# Patient Record
Sex: Female | Born: 1980 | Hispanic: Yes | Marital: Single | Smoking: Never smoker
Health system: Southern US, Community
[De-identification: ages and names within clinical notes are randomized; demographics above are authoritative.]

## PROBLEM LIST (undated history)

## (undated) DIAGNOSIS — E079 Disorder of thyroid, unspecified: Secondary | ICD-10-CM

## (undated) DIAGNOSIS — E7800 Pure hypercholesterolemia, unspecified: Secondary | ICD-10-CM

---

## 2007-11-10 ENCOUNTER — Inpatient Hospital Stay: Payer: Self-pay | Admitting: Obstetrics and Gynecology

## 2008-06-13 ENCOUNTER — Emergency Department: Payer: Self-pay | Admitting: Emergency Medicine

## 2013-06-01 ENCOUNTER — Emergency Department: Payer: Self-pay | Admitting: Emergency Medicine

## 2016-02-22 ENCOUNTER — Other Ambulatory Visit: Payer: Self-pay | Admitting: Family Medicine

## 2016-02-22 DIAGNOSIS — Z3481 Encounter for supervision of other normal pregnancy, first trimester: Secondary | ICD-10-CM

## 2016-02-26 ENCOUNTER — Ambulatory Visit
Admission: RE | Admit: 2016-02-26 | Discharge: 2016-02-26 | Disposition: A | Discharge status: Home / Self Care | Payer: Medicaid Other | Source: Ambulatory Visit | Attending: Family Medicine | Admitting: Family Medicine

## 2016-02-26 DIAGNOSIS — Z3A15 15 weeks gestation of pregnancy: Secondary | ICD-10-CM | POA: Insufficient documentation

## 2016-02-26 DIAGNOSIS — N83202 Unspecified ovarian cyst, left side: Secondary | ICD-10-CM | POA: Insufficient documentation

## 2016-02-26 DIAGNOSIS — N83201 Unspecified ovarian cyst, right side: Secondary | ICD-10-CM | POA: Diagnosis not present

## 2016-02-26 DIAGNOSIS — Z3481 Encounter for supervision of other normal pregnancy, first trimester: Secondary | ICD-10-CM

## 2016-02-26 DIAGNOSIS — Z3482 Encounter for supervision of other normal pregnancy, second trimester: Secondary | ICD-10-CM | POA: Diagnosis not present

## 2019-01-01 ENCOUNTER — Ambulatory Visit
Admission: EM | Admit: 2019-01-01 | Discharge: 2019-01-01 | Disposition: A | Discharge status: Home / Self Care | Payer: Self-pay | Attending: Family Medicine | Admitting: Family Medicine

## 2019-01-01 ENCOUNTER — Other Ambulatory Visit: Payer: Self-pay

## 2019-01-01 ENCOUNTER — Encounter: Payer: Self-pay | Admitting: Emergency Medicine

## 2019-01-01 DIAGNOSIS — B9689 Other specified bacterial agents as the cause of diseases classified elsewhere: Secondary | ICD-10-CM

## 2019-01-01 DIAGNOSIS — N760 Acute vaginitis: Secondary | ICD-10-CM

## 2019-01-01 HISTORY — DX: Disorder of thyroid, unspecified: E07.9

## 2019-01-01 HISTORY — DX: Pure hypercholesterolemia, unspecified: E78.00

## 2019-01-01 LAB — URINALYSIS, COMPLETE (UACMP) WITH MICROSCOPIC
Bilirubin Urine: NEGATIVE
Glucose, UA: NEGATIVE mg/dL
Hgb urine dipstick: NEGATIVE
Ketones, ur: NEGATIVE mg/dL
Leukocytes,Ua: NEGATIVE
Nitrite: NEGATIVE
Protein, ur: NEGATIVE mg/dL
RBC / HPF: NONE SEEN RBC/hpf (ref 0–5)
Specific Gravity, Urine: 1.02 (ref 1.005–1.030)
pH: 5 (ref 5.0–8.0)

## 2019-01-01 LAB — WET PREP, GENITAL
Sperm: NONE SEEN
Trich, Wet Prep: NONE SEEN
Yeast Wet Prep HPF POC: NONE SEEN

## 2019-01-01 MED ORDER — METRONIDAZOLE 500 MG PO TABS: 500.0000 mg | ORAL_TABLET | Freq: Two times a day (BID) | ORAL | 0 refills | Status: AC

## 2019-01-01 NOTE — ED Triage Notes (Signed)
Patient c/o vaginal itching that started on Monday after she finished her menstrual cycle.  Patient denies vaginal discharge.  Patient denies any pain.

## 2019-01-01 NOTE — Discharge Instructions (Signed)
Tienes vaginosis bacteriana.  Esto es causado por bacterias en la vagina.  Tome el antibitico segn lo prescrito.  Dr. Adriana Simas

## 2019-01-01 NOTE — ED Notes (Signed)
Interpretor [id number] used during triage.

## 2019-01-01 NOTE — ED Provider Notes (Signed)
[facility]    CSN: [id number] Arrival date & time: 01/01/19  1008  History   Chief Complaint Chief Complaint  Patient presents with  . Vaginal Itching   HPI  38 year old female presents with the above complaint.  Spanish Interpreter used: [phone].  Patient reports a one-week history of vaginal itching.  She states that it started after she began her menstrual cycle.  Has continued to persist despite cessation of her menstrual cycle.  She reports that she has had dysuria as well.  Denies urinary frequency.  No abdominal pain.  No fever.  No medications or interventions tried.  No known exacerbating or relieving factors.  No other complaints.  PMH, Surgical Hx, Family Hx, Social History reviewed and updated as below.  Past Medical History:  Diagnosis Date  . Hypercholesterolemia   . Thyroid disease    Past Surgical History:  Procedure Laterality Date  . CESAREAN SECTION      OB History   No obstetric history on file.      Home Medications    Prior to Admission medications   Medication Sig Start Date End Date Taking? Authorizing Provider  levothyroxine (SYNTHROID) 50 MCG tablet Take 50 mcg by mouth every morning. 10/18/18  Yes [provider]  simvastatin (ZOCOR) 20 MG tablet Take 20 mg by mouth at bedtime. 10/18/18  Yes [provider]  metroNIDAZOLE (FLAGYL) 500 MG tablet Take 1 tablet (500 mg total) by mouth 2 (two) times daily. 01/01/19   Tommie Sams, DO    Family History Family History  Problem Relation Age of Onset  . Healthy Mother   . Hypertension Father   . Hyperlipidemia Father     Social History Social History   Tobacco Use  . Smoking status: Never Smoker  . Smokeless tobacco: Never Used  Substance Use Topics  . Alcohol use: Never    Frequency: Never  . Drug use: Never     Allergies   Patient has no known allergies.   Review of Systems Review of Systems  Constitutional: Negative.   Gastrointestinal:  Negative for abdominal pain.  Genitourinary: Positive for dysuria.       Vaginal itching.   Physical Exam Triage Vital Signs ED Triage Vitals  Enc Vitals Group     BP 01/01/19 1023 124/72     Pulse Rate 01/01/19 1023 69     Resp 01/01/19 1023 14     Temp 01/01/19 1023 98 F (36.7 C)     Temp Source 01/01/19 1023 Oral     SpO2 01/01/19 1023 100 %     Weight 01/01/19 1019 194 lb (88 kg)     Height 01/01/19 1019 5' (1.524 m)     Head Circumference --      Peak Flow --      Pain Score 01/01/19 1019 0     Pain Loc --      Pain Edu? --      Excl. in GC? --    Updated Vital Signs BP 124/72 (BP Location: Left Arm)   Pulse 69   Temp 98 F (36.7 C) (Oral)   Resp 14   Ht 5' (1.524 m)   Wt 88 kg   LMP 12/23/2018 (Exact Date)   SpO2 100%   BMI 37.89 kg/m   Visual Acuity Right Eye Distance:   Left Eye Distance:   Bilateral Distance:    Right Eye Near:   Left Eye Near:    Bilateral Near:  Physical Exam Constitutional:      General: She is not in acute distress.    Appearance: Normal appearance. She is obese.  HENT:     Head: Normocephalic and atraumatic.  Eyes:     General:        Right eye: No discharge.        Left eye: No discharge.     Conjunctiva/sclera: Conjunctivae normal.  Cardiovascular:     Rate and Rhythm: Normal rate and regular rhythm.  Pulmonary:     Effort: Pulmonary effort is normal.     Breath sounds: Normal breath sounds.  Abdominal:     General: There is no distension.     Palpations: Abdomen is soft.     Tenderness: There is no abdominal tenderness.  Neurological:     Mental Status: She is alert.  Psychiatric:        Mood and Affect: Mood normal.        Behavior: Behavior normal.    UC Treatments / Results  Labs (all labs ordered are listed, but only abnormal results are displayed) Labs Reviewed  WET PREP, GENITAL - Abnormal; Notable for the following components:      Result Value   Clue Cells Wet Prep HPF POC PRESENT (*)    WBC,  Wet Prep HPF POC MODERATE (*)    All other components within normal limits  URINALYSIS, COMPLETE (UACMP) WITH MICROSCOPIC - Abnormal; Notable for the following components:   Bacteria, UA RARE (*)    All other components within normal limits    EKG None  Radiology No results found.  Procedures Procedures (including critical care time)  Medications Ordered in UC Medications - No data to display  Initial Impression / Assessment and Plan / UC Course  I have reviewed the triage vital signs and the nursing notes.  Pertinent labs & imaging results that were available during my care of the patient were reviewed by me and considered in my medical decision making (see chart for details).    38 year old female presents with vaginal itching and dysuria.  Analysis negative.  Wet prep with clue cells consistent with bacterial vaginosis.  Treating with Flagyl.  Final Clinical Impressions(s) / UC Diagnoses   Final diagnoses:  BV (bacterial vaginosis)     Discharge Instructions     Tienes vaginosis bacteriana.  Esto es causado por bacterias en la vagina.  Tome el antibitico segn lo prescrito.  Dr. Adriana Simasook    ED Prescriptions    Medication Sig Dispense Auth. Provider   metroNIDAZOLE (FLAGYL) 500 MG tablet Take 1 tablet (500 mg total) by mouth 2 (two) times daily. 14 tablet Tommie Samsook, Rogelio Winbush G, DO     Controlled Substance Prescriptions [place] Controlled Substance Registry consulted? Not Applicable   Tommie SamsCook, Kathyjo Briere G, DO 01/01/19 1057

## 2019-04-29 ENCOUNTER — Other Ambulatory Visit: Payer: Self-pay

## 2019-04-29 DIAGNOSIS — Z20822 Contact with and (suspected) exposure to covid-19: Secondary | ICD-10-CM

## 2019-04-30 LAB — NOVEL CORONAVIRUS, NAA: SARS-CoV-2, NAA: DETECTED — AB

## 2019-05-19 ENCOUNTER — Other Ambulatory Visit: Payer: Self-pay

## 2019-05-19 DIAGNOSIS — Z20822 Contact with and (suspected) exposure to covid-19: Secondary | ICD-10-CM

## 2019-05-20 LAB — NOVEL CORONAVIRUS, NAA: SARS-CoV-2, NAA: NOT DETECTED

## 2020-01-04 ENCOUNTER — Other Ambulatory Visit: Payer: Self-pay | Admitting: Obstetrics and Gynecology

## 2020-01-04 DIAGNOSIS — N83201 Unspecified ovarian cyst, right side: Secondary | ICD-10-CM

## 2020-01-04 DIAGNOSIS — N83202 Unspecified ovarian cyst, left side: Secondary | ICD-10-CM

## 2020-01-11 ENCOUNTER — Ambulatory Visit
Admission: RE | Admit: 2020-01-11 | Discharge: 2020-01-11 | Disposition: A | Discharge status: Home / Self Care | Payer: Self-pay | Source: Ambulatory Visit | Attending: Obstetrics and Gynecology | Admitting: Obstetrics and Gynecology

## 2020-01-11 ENCOUNTER — Other Ambulatory Visit: Payer: Self-pay

## 2020-01-11 DIAGNOSIS — N83202 Unspecified ovarian cyst, left side: Secondary | ICD-10-CM | POA: Insufficient documentation

## 2020-01-11 DIAGNOSIS — N83201 Unspecified ovarian cyst, right side: Secondary | ICD-10-CM | POA: Insufficient documentation

## 2021-10-04 IMAGING — US US PELVIS COMPLETE WITH TRANSVAGINAL
2 series · 13 of 25 positions shown · non-contrast
Comparison: None

CLINICAL DATA: Ovarian cysts bilaterally, history of Caesarean
section

EXAM:
TRANSABDOMINAL AND TRANSVAGINAL ULTRASOUND OF PELVIS
TECHNIQUE: Both transabdominal and transvaginal ultrasound examinations of the
pelvis were performed. Transabdominal technique was performed for
global imaging of the pelvis including uterus, ovaries, adnexal
regions, and pelvic cul-de-sac. It was necessary to proceed with
endovaginal exam following the transabdominal exam to visualize the
endometrium and ovaries.

[Series 1: us pelvic complete with transvaginal · 12 of 137 slices shown]
[im 1/137]
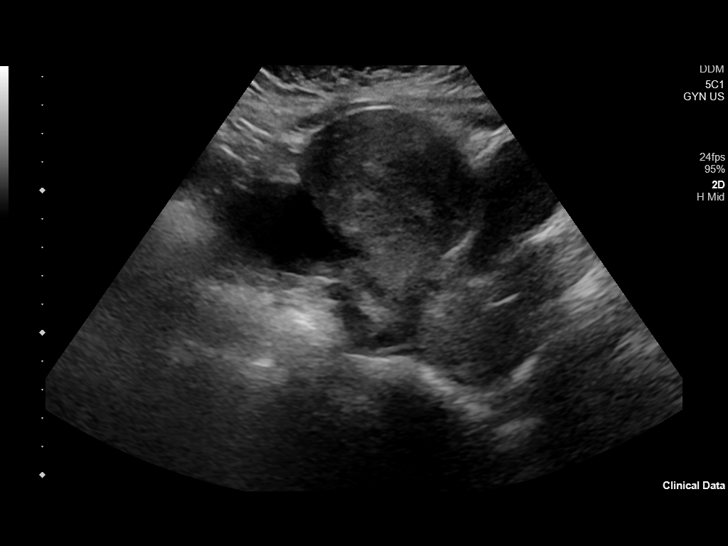
[im 12/137]
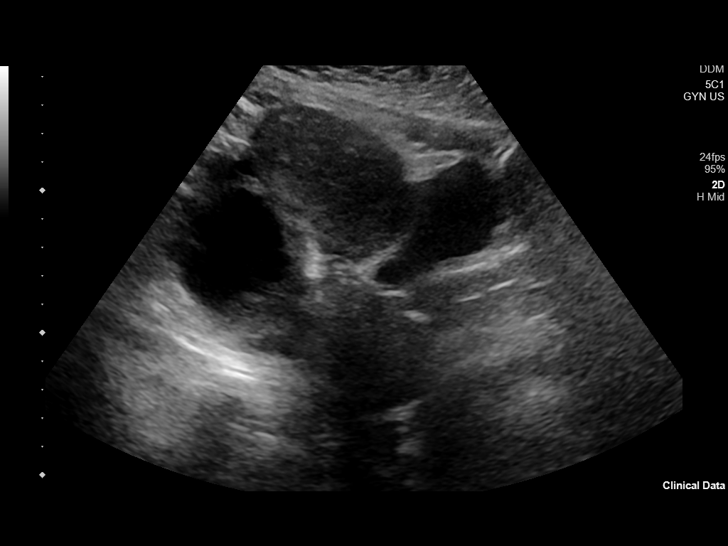
[im 24/137]
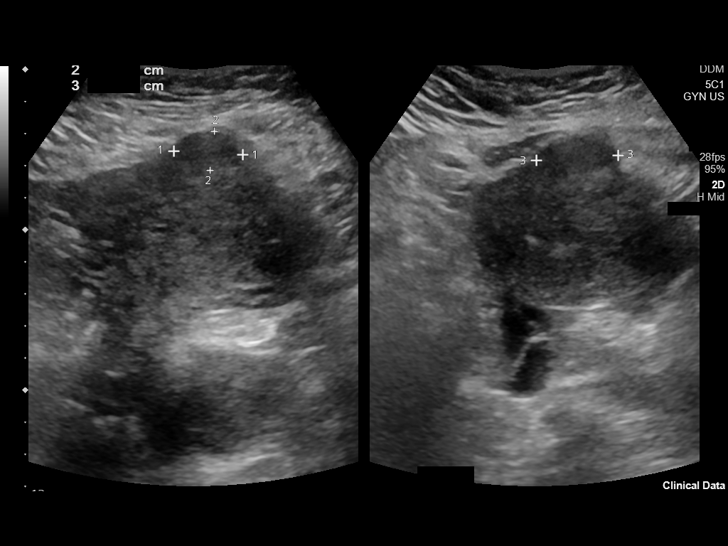
[im 36/137]
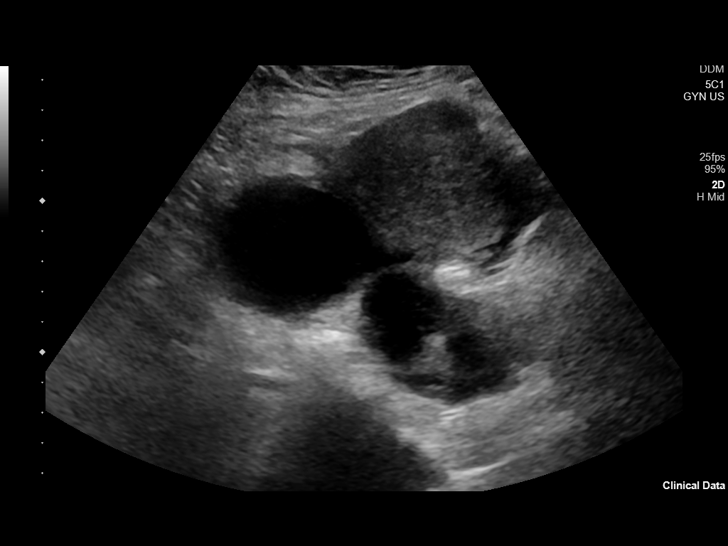
[im 48/137]
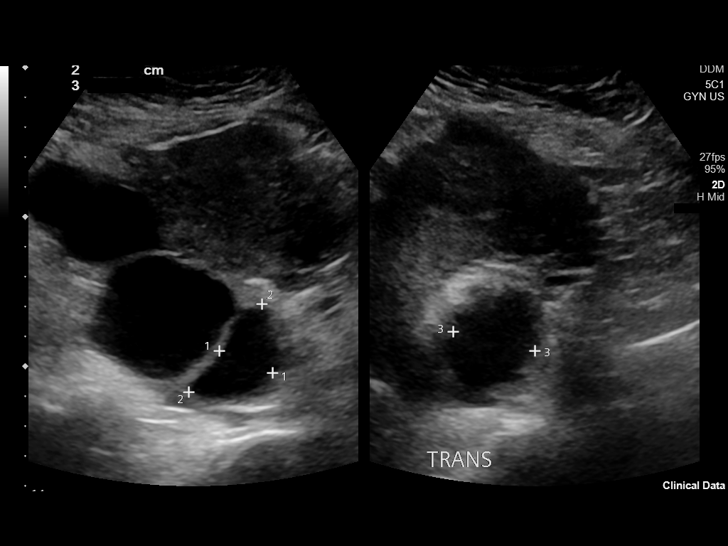
[im 60/137]
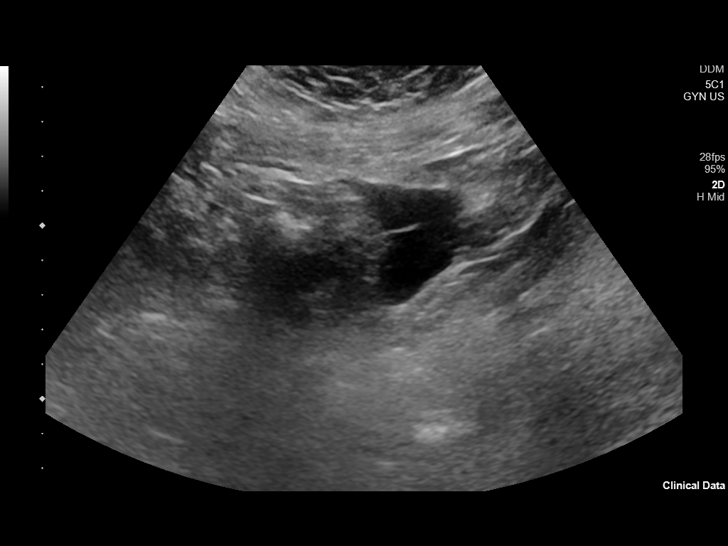
[im 71/137]
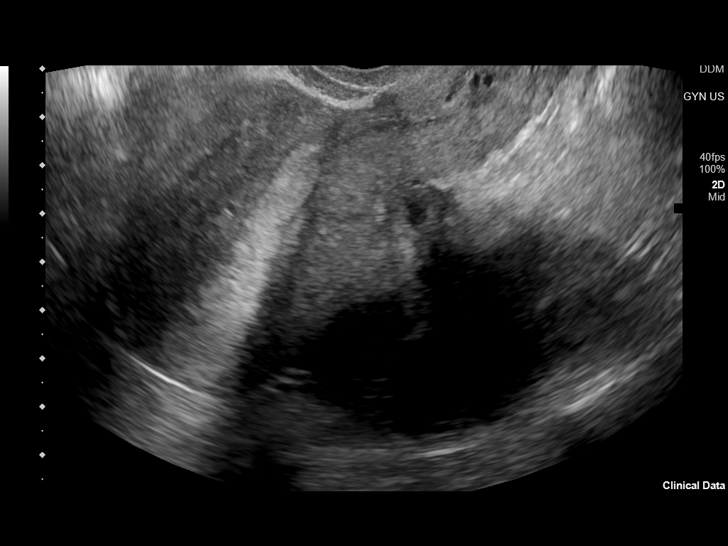
[im 83/137]
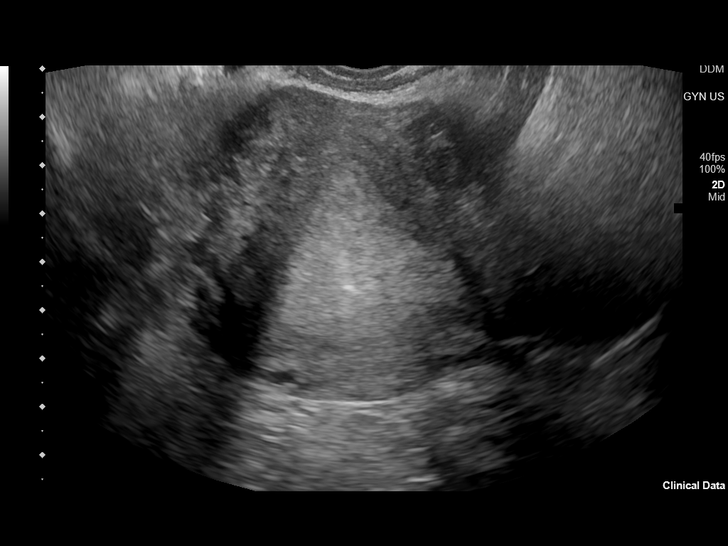
[im 95/137]
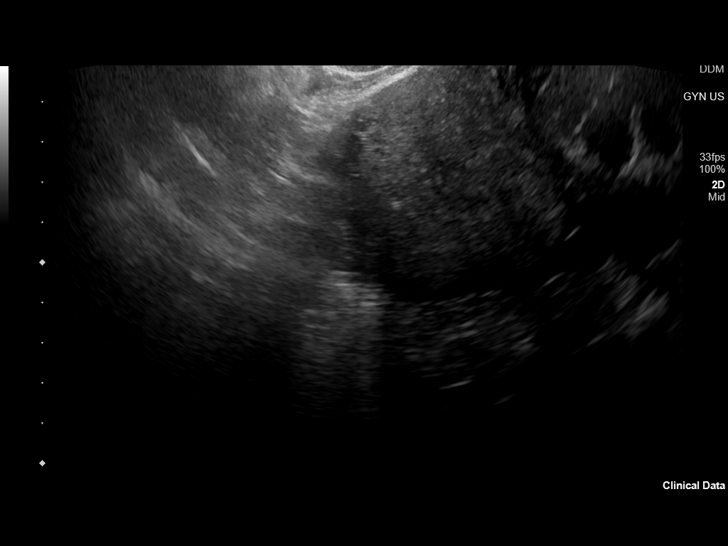
[im 107/137]
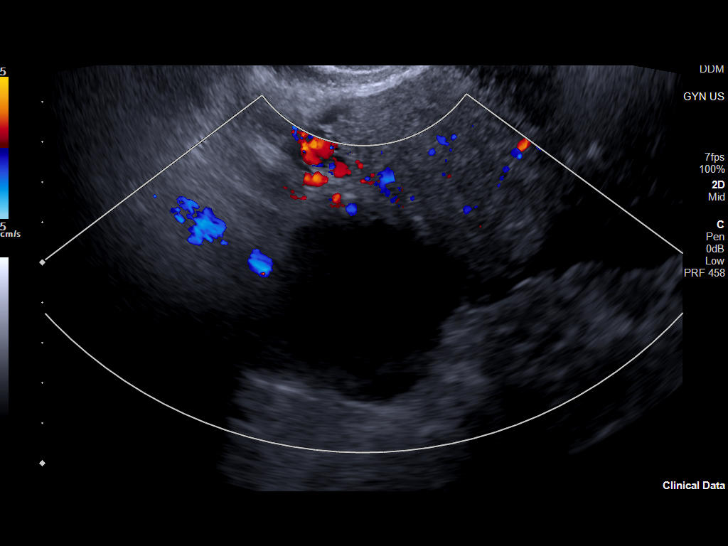
[im 119/137]
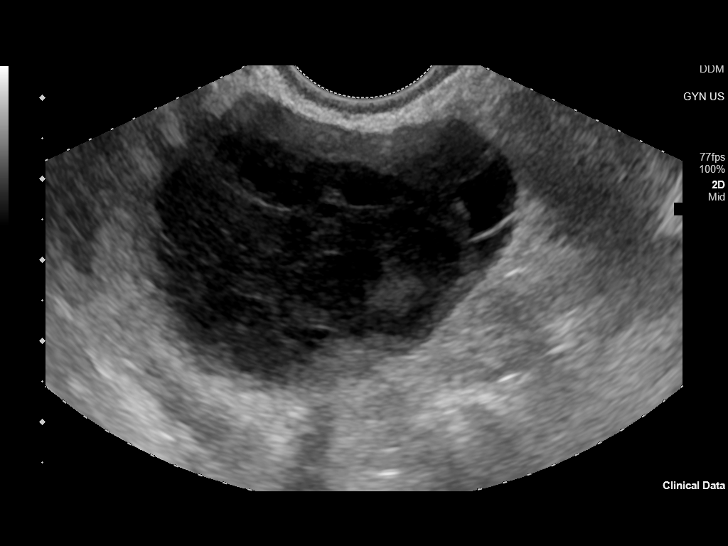
[im 131/137]
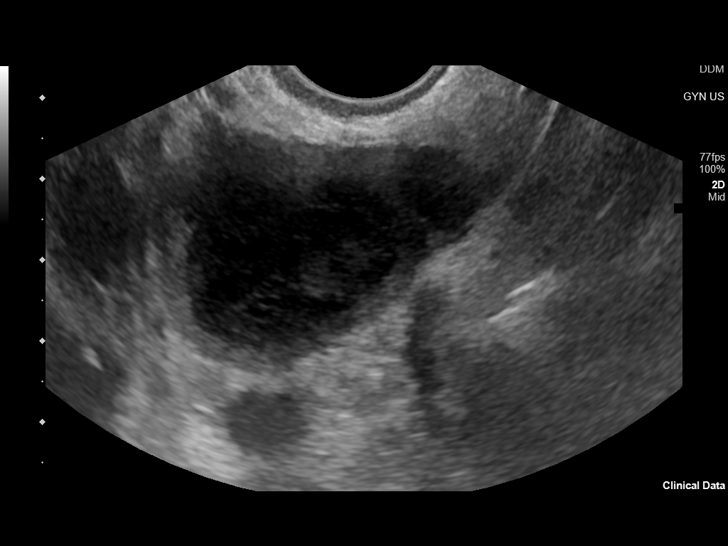

[Series 1002: gyn us · 1 of 2 slices shown]
[im 1/2]
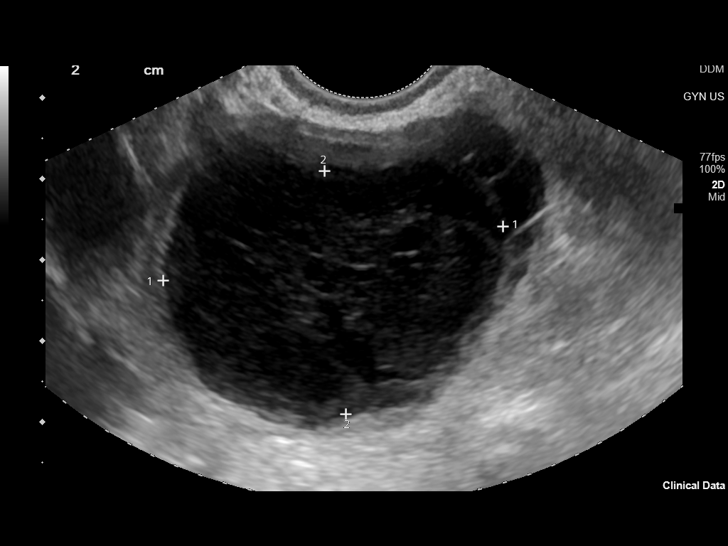

[13 of 25 positions shown; findings below may reference images not displayed]

FINDINGS: Uterus

Measurements: 11.1 x 5.4 x 7.6 cm = volume: 237 mL. Anteverted and
mildly retroflexed. Nabothian cysts at cervix. Several uterine
leiomyomata are identified including a 2.2 x 1.2 x 2.6 cm leiomyoma
at anterior upper uterine segment, subserosal leiomyoma posterior
mid uterus 2.0 x 1.3 x 1.3 cm, and an anterior LEFT subserosal
leiomyoma 2.7 x 2.7 x 2.5 cm.

Endometrium

Thickness: 8 mm.  No endometrial fluid or focal abnormality

Right ovary

No normal appearing RIGHT ovary visualized. In the RIGHT adnexa at
least 3 large cysts are identified, potentially of RIGHT ovarian
origin, largest 5.7 x 5.3 x 4.4 cm. In aggregate these 3 lesions
measure 11.7 x 5.6 x 8.3 cm nurse operated by thin septations. The
smallest of the 3 lesions contain scattered internal echogenicity in
appears more complex.

Left ovary

Measurements: 5.1 x 2.9 x 2.9 cm = volume: 21.9 mL. Large
hemorrhagic cyst of LEFT ovary 4.3 x 3.0 x 3.1 cm.

Other findings

No free pelvic fluid or additional adnexal masses.
IMPRESSION: Multiple uterine leiomyomata.

Hemorrhagic cyst LEFT ovary 4.3 cm greatest size.

3 large adnexal cysts in aggregate 11.7 x 5.6 x 8.3 cm, with largest
locule measuring 5.7 cm in greatest size and the smallest lesion
containing internal echogenicity/complexity; either characterization
by MR or follow-up ultrasound in 6-12 weeks recommended to reassess.

## 2022-11-25 ENCOUNTER — Encounter: Payer: Self-pay | Admitting: Family Medicine

## 2022-12-16 ENCOUNTER — Other Ambulatory Visit: Payer: Self-pay

## 2022-12-16 DIAGNOSIS — Z1231 Encounter for screening mammogram for malignant neoplasm of breast: Secondary | ICD-10-CM

## 2022-12-23 ENCOUNTER — Ambulatory Visit
Admission: RE | Admit: 2022-12-23 | Discharge: 2022-12-23 | Disposition: A | Discharge status: Home / Self Care | Payer: Self-pay | Source: Ambulatory Visit | Attending: Obstetrics and Gynecology | Admitting: Obstetrics and Gynecology

## 2022-12-23 ENCOUNTER — Ambulatory Visit: Payer: Self-pay | Attending: Hematology and Oncology | Admitting: Hematology and Oncology

## 2022-12-23 VITALS — BP 116/79 | Wt 208.6 lb

## 2022-12-23 DIAGNOSIS — Z1231 Encounter for screening mammogram for malignant neoplasm of breast: Secondary | ICD-10-CM

## 2022-12-23 NOTE — Patient Instructions (Signed)
[name] about self breast awareness and gave educational materials to take home. Patient did not need a Pap smear today due to last Pap smear was in 2022 per patient. Let her know [facility] will cover Pap smears every 5 years unless has a history of abnormal Pap smears. [facility] Appointment scheduled for 12/23/22. Patient aware of appointment and will be there. Let patient know will follow up with her within the next couple weeks with results. [name] verbalized understanding.  Pascal Lux, NP 10:04 AM

## 2022-12-23 NOTE — Progress Notes (Signed)
Ms. Tara Beck is a 42 y.o. female who presents to [facility] clinic today with no complaints.    Pap Smear: Pap not smear completed today. Last Pap smear was 2022 at [facility] clinic and was normal. Per patient has no history of an abnormal Pap smear. Last Pap smear result is available in Epic.   Physical exam: Breasts Breasts symmetrical. No skin abnormalities bilateral breasts. No nipple retraction bilateral breasts. No nipple discharge bilateral breasts. No lymphadenopathy. No lumps palpated bilateral breasts.        Pelvic/Bimanual Pap is not indicated today    Smoking History: Patient has never smoked and was not referred to quit line.    Patient Navigation: Patient education provided. Access to services provided for patient through [facility] program. Tara Beck interpreter provided. No transportation provided   Colorectal Cancer Screening: Per patient has never had colonoscopy completed No complaints today.    Breast and Cervical Cancer Risk Assessment: Patient does not have family history of breast cancer, known genetic mutations, or radiation treatment to the chest before age 75. Patient does not have history of cervical dysplasia, immunocompromised, or DES exposure in-utero.  Risk Assessment   No risk assessment data     A: [facility] exam without pap smear No complaints with benign exam.   P: [facility]. Appointment scheduled 12/23/22.  Tara Beck A, NP 12/23/2022 10:03 AM

## 2024-01-05 ENCOUNTER — Other Ambulatory Visit: Payer: Self-pay

## 2024-01-05 DIAGNOSIS — Z1231 Encounter for screening mammogram for malignant neoplasm of breast: Secondary | ICD-10-CM

## 2024-02-15 ENCOUNTER — Ambulatory Visit: Payer: Self-pay | Attending: Obstetrics and Gynecology | Admitting: *Deleted

## 2024-02-15 ENCOUNTER — Ambulatory Visit
Admission: RE | Admit: 2024-02-15 | Discharge: 2024-02-15 | Disposition: A | Discharge status: Home / Self Care | Payer: Self-pay | Source: Ambulatory Visit | Attending: Obstetrics and Gynecology | Admitting: Obstetrics and Gynecology

## 2024-02-15 VITALS — BP 102/62 | Wt 196.1 lb

## 2024-02-15 DIAGNOSIS — Z1231 Encounter for screening mammogram for malignant neoplasm of breast: Secondary | ICD-10-CM | POA: Insufficient documentation

## 2024-02-15 DIAGNOSIS — Z1239 Encounter for other screening for malignant neoplasm of breast: Secondary | ICD-10-CM

## 2024-02-15 NOTE — Progress Notes (Signed)
 Ms. Tara Beck is a 43 y.o. female who presents to [facility] clinic today with no complaints.    Pap Smear: Pap smear not completed today. Last Pap smear was 10/15/2023 at [facility] clinic and was normal with negative HPV. Per patient has no history of an abnormal Pap smear. Last Pap smear result is available in Epic.   Physical exam: Breasts Breasts symmetrical. No skin abnormalities bilateral breasts. No nipple retraction bilateral breasts. No nipple discharge bilateral breasts. No lymphadenopathy. No lumps palpated bilateral breasts. No complaints of pain or tenderness on exam.  MS 3D SCR MAMMO BILAT BR (aka MM) Result Date: 12/24/2022 CLINICAL DATA:  Screening. EXAM: DIGITAL SCREENING BILATERAL MAMMOGRAM WITH TOMOSYNTHESIS AND CAD TECHNIQUE: Bilateral screening digital craniocaudal and mediolateral oblique mammograms were obtained. Bilateral screening digital breast tomosynthesis was performed. The images were evaluated with computer-aided detection. COMPARISON:  None available. ACR Breast Density Category c: The breasts are heterogeneously dense, which may obscure small masses. FINDINGS: There are no findings suspicious for malignancy. IMPRESSION: No mammographic evidence of malignancy. A result letter of this screening mammogram will be mailed directly to the patient. RECOMMENDATION: Screening mammogram in one year. (Code:SM-B-01Y) BI-RADS CATEGORY  1: Negative. Electronically Signed   By: Anna Barnes M.D.   On: 12/24/2022 11:24       Pelvic/Bimanual Pap is not indicated today per [facility] guidelines.   Smoking History: Patient has never smoked.   Patient Navigation: Patient education provided. Access to services provided for patient through [employer] program. Spanish interpreter Tara Beck from [facility] provided.   Breast and Cervical Cancer Risk Assessment: Patient does not have family history of breast cancer, known genetic mutations, or radiation treatment to the chest  before age 81. Patient does not have history of cervical dysplasia, immunocompromised, or DES exposure in-utero.  Risk Scores as of Encounter on 02/15/2024     Gregary Lean as of 12/23/2022           5-year 0.48%   Lifetime 7.95%   This patient is [place]/Lat[place] but has no documented birth country, so the [employer] model used data from [place] patients to calculate their risk score. Document a birth country in the Demographics activity for a more accurate score.         Last calculated by Algie Ingle, LPN on 0/86/5784 at 10:20 AM        A: [facility] exam without pap smear No complaints.  P: Referred patient to the [facility] for a screening mammogram. Appointment scheduled Monday, February 15, 2024 at 1140.  Stefan Edge, RN 02/15/2024 11:20 AM

## 2024-02-15 NOTE — Patient Instructions (Signed)
 Explained breast self awareness with Tara Beck. Patient did not need a Pap smear today due to last Pap smear and HPV typing was 10/15/2023. Let her know [facility] will cover Pap smears and HPV typing every 5 years unless has a history of abnormal Pap smears. Referred patient to the [facility] for a screening mammogram. Appointment scheduled Monday, February 15, 2024 at 1140. Patient aware of appointment and will be there. Let patient know Tony Frederickson will follow up with her within the next couple weeks with results of her mammogram by letter or phone. Tara Beck verbalized understanding.  Neill Jurewicz, Dela Favor, RN 11:20 AM
